# Patient Record
Sex: Female | Born: 2000 | Race: White | Hispanic: No | Marital: Single | Smoking: Never smoker
Health system: Southern US, Community
[De-identification: ages and names within clinical notes are randomized; demographics above are authoritative.]

## PROBLEM LIST (undated history)

## (undated) DIAGNOSIS — Z789 Other specified health status: Secondary | ICD-10-CM

## (undated) HISTORY — PX: FRACTURE SURGERY: SHX138

---
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
## 2020-08-24 HISTORY — PX: OTHER SURGICAL HISTORY: SHX169

## 2021-10-25 ENCOUNTER — Encounter (HOSPITAL_BASED_OUTPATIENT_CLINIC_OR_DEPARTMENT_OTHER): Payer: Self-pay | Admitting: Orthopedic Surgery

## 2021-10-26 ENCOUNTER — Encounter (HOSPITAL_BASED_OUTPATIENT_CLINIC_OR_DEPARTMENT_OTHER): Payer: Self-pay | Admitting: Orthopedic Surgery

## 2021-10-26 DIAGNOSIS — S83241A Other tear of medial meniscus, current injury, right knee, initial encounter: Secondary | ICD-10-CM | POA: Diagnosis present

## 2021-10-26 HISTORY — DX: Other tear of medial meniscus, current injury, right knee, initial encounter: S83.241A

## 2021-10-26 NOTE — H&P (Signed)
Whitney Gardner is an 21 y.o. female.   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 The MRI on 08-13-21 shows no definite abnormalities. However, I reviewed this MRI with the radiologist and we both agree she has significant findings of a probable meniscal tear. She continues to have locking, catching and popping in her knee. She has not responded to injections, physical therapy and rest. She is having pain,swelling, locking, catching and popping.  Past Medical History:  Diagnosis Date   Acute medial meniscus tear of right knee 10/26/2021   Medical history non-contributory     Past Surgical History:  Procedure Laterality Date   colonoscopy with biopsy  08/24/2020   FRACTURE SURGERY Right    finger    History reviewed. No pertinent family history. Social History:  has no history on file for tobacco use, alcohol use, and drug use.  Allergies: No Known Allergies  Meds: Nikki 3mg     Review of Systems  Constitutional: Negative.   HENT: Negative.    Eyes: Negative.   Respiratory: Negative.    Cardiovascular: Negative.   Gastrointestinal: Negative.   Endocrine: Negative.   Genitourinary: Negative.   Musculoskeletal:  Positive for gait problem and joint swelling.  Skin: Negative.   Allergic/Immunologic: Negative.   Hematological: Negative.   Psychiatric/Behavioral: Negative.     Height 5\' 10"  (1.778 m), weight 54.3 kg. Physical Exam Constitutional:      Appearance: Normal appearance. She is normal weight.  HENT:     Head: Normocephalic and atraumatic.     Right Ear: External ear normal.     Left Ear: External ear normal.     Nose: Nose normal.     Mouth/Throat:     Mouth: Mucous membranes are moist.     Pharynx: Oropharynx is clear.  Eyes:     Conjunctiva/sclera: Conjunctivae normal.  Cardiovascular:     Rate and Rhythm: Normal rate.   Pulmonary:     Effort: Pulmonary effort is normal.  Abdominal:     Palpations: Abdomen is soft.  Genitourinary:    Comments: Not pertinent to current symptomatology therefore not examined.  Musculoskeletal:     Cervical back: Neck supple.     Comments: On examination the right knee reveals pain medially. Positive medial McMurray's. She has 1+ effusion. The range of motion is from0-120 degrees.  The knee is stable.   Skin:    General: Skin is warm.     Capillary Refill: Capillary refill takes less than 2 seconds.  Neurological:     General: No focal deficit present.     Mental Status: She is alert.  Psychiatric:        Mood and Affect: Mood normal.     Assessment Principal Problem:   Acute medial meniscus tear of right knee    Plan Right knee arthroscopy with partial medial meniscectomy.  The risks, benefits, and possible complications of the procedure were discussed in detail with the patient.  The patient is without question.   Karim Aiello J Mycal Conde, PA-C 10/26/2021, 12:13 PM

## 2021-10-28 ENCOUNTER — Encounter (HOSPITAL_BASED_OUTPATIENT_CLINIC_OR_DEPARTMENT_OTHER): Payer: Self-pay | Admitting: Orthopedic Surgery

## 2021-10-28 ENCOUNTER — Other Ambulatory Visit: Payer: Self-pay

## 2021-10-28 NOTE — Progress Notes (Signed)

## 2021-10-30 ENCOUNTER — Encounter (HOSPITAL_BASED_OUTPATIENT_CLINIC_OR_DEPARTMENT_OTHER): Payer: Self-pay | Admitting: Orthopedic Surgery

## 2021-10-30 ENCOUNTER — Ambulatory Visit (HOSPITAL_BASED_OUTPATIENT_CLINIC_OR_DEPARTMENT_OTHER): Payer: No Typology Code available for payment source | Admitting: Certified Registered"

## 2021-10-30 ENCOUNTER — Encounter (HOSPITAL_BASED_OUTPATIENT_CLINIC_OR_DEPARTMENT_OTHER)
Admission: RE | Disposition: A | Discharge status: Home / Self Care | Payer: Self-pay | Source: Home / Self Care | Attending: Orthopedic Surgery

## 2021-10-30 ENCOUNTER — Ambulatory Visit (HOSPITAL_BASED_OUTPATIENT_CLINIC_OR_DEPARTMENT_OTHER)
Admission: RE | Admit: 2021-10-30 | Discharge: 2021-10-30 | Disposition: A | Discharge status: Home / Self Care | Payer: No Typology Code available for payment source | Attending: Orthopedic Surgery | Admitting: Orthopedic Surgery

## 2021-10-30 ENCOUNTER — Other Ambulatory Visit: Payer: Self-pay

## 2021-10-30 DIAGNOSIS — X501XXA Overexertion from prolonged static or awkward postures, initial encounter: Secondary | ICD-10-CM | POA: Insufficient documentation

## 2021-10-30 DIAGNOSIS — S83241A Other tear of medial meniscus, current injury, right knee, initial encounter: Secondary | ICD-10-CM | POA: Diagnosis not present

## 2021-10-30 HISTORY — PX: KNEE ARTHROSCOPY WITH MEDIAL MENISECTOMY: SHX5651

## 2021-10-30 HISTORY — DX: Other specified health status: Z78.9

## 2021-10-30 LAB — POCT PREGNANCY, URINE: Preg Test, Ur: NEGATIVE

## 2021-10-30 SURGERY — ARTHROSCOPY, KNEE, WITH MEDIAL MENISCECTOMY
Anesthesia: Regional | Site: Knee | Laterality: Right

## 2021-10-30 MED ORDER — POVIDONE-IODINE 7.5 % EX SOLN
Freq: Once | CUTANEOUS | Status: AC
  Filled 2021-10-30: qty 118

## 2021-10-30 MED ORDER — DEXAMETHASONE SODIUM PHOSPHATE 4 MG/ML IJ SOLN
INTRAMUSCULAR | Status: DC | PRN
  Administered 2021-10-30: 5 mg via INTRAVENOUS

## 2021-10-30 MED ORDER — PROMETHAZINE HCL 12.5 MG PO TABS: 12.5000 mg | ORAL_TABLET | Freq: Four times a day (QID) | ORAL | 0 refills | Status: DC | PRN

## 2021-10-30 MED ORDER — CEFAZOLIN SODIUM-DEXTROSE 2-4 GM/100ML-% IV SOLN
2.0000 g | INTRAVENOUS | Status: AC
  Administered 2021-10-30: 2 g via INTRAVENOUS

## 2021-10-30 MED ORDER — MIDAZOLAM HCL 2 MG/2ML IJ SOLN
2.0000 mg | Freq: Once | INTRAMUSCULAR | Status: AC
  Administered 2021-10-30: 2 mg via INTRAVENOUS

## 2021-10-30 MED ORDER — LACTATED RINGERS IV SOLN: INTRAVENOUS | Status: DC

## 2021-10-30 MED ORDER — FENTANYL CITRATE (PF) 100 MCG/2ML IJ SOLN
INTRAMUSCULAR | Status: DC | PRN
  Administered 2021-10-30 (×2): 25 ug via INTRAVENOUS
  Administered 2021-10-30: 50 ug via INTRAVENOUS

## 2021-10-30 MED ORDER — LIDOCAINE 2% (20 MG/ML) 5 ML SYRINGE
INTRAMUSCULAR | Status: DC | PRN
  Administered 2021-10-30: 60 mg via INTRAVENOUS

## 2021-10-30 MED ORDER — BUPIVACAINE-EPINEPHRINE (PF) 0.25% -1:200000 IJ SOLN
INTRAMUSCULAR | Status: AC
  Filled 2021-10-30: qty 30

## 2021-10-30 MED ORDER — BUPIVACAINE HCL (PF) 0.5 % IJ SOLN
INTRAMUSCULAR | Status: DC | PRN
Start: 2021-10-30 — End: 2021-10-30
  Administered 2021-10-30: 20 mL via INTRA_ARTICULAR

## 2021-10-30 MED ORDER — MIDAZOLAM HCL 2 MG/2ML IJ SOLN
INTRAMUSCULAR | Status: AC
  Filled 2021-10-30: qty 2

## 2021-10-30 MED ORDER — PROPOFOL 10 MG/ML IV BOLUS
INTRAVENOUS | Status: DC | PRN
  Administered 2021-10-30: 150 mg via INTRAVENOUS

## 2021-10-30 MED ORDER — SODIUM CHLORIDE 0.9 % IR SOLN
Status: DC | PRN
  Administered 2021-10-30: 3000 mL

## 2021-10-30 MED ORDER — HYDROCODONE-ACETAMINOPHEN 5-325 MG PO TABS: 1.0000 | ORAL_TABLET | ORAL | 0 refills | Status: DC | PRN

## 2021-10-30 MED ORDER — FENTANYL CITRATE (PF) 100 MCG/2ML IJ SOLN
INTRAMUSCULAR | Status: AC
  Filled 2021-10-30: qty 2

## 2021-10-30 MED ORDER — FENTANYL CITRATE (PF) 100 MCG/2ML IJ SOLN: 25.0000 ug | INTRAMUSCULAR | Status: DC | PRN

## 2021-10-30 MED ORDER — ACETAMINOPHEN 10 MG/ML IV SOLN: 1000.0000 mg | Freq: Once | INTRAVENOUS | Status: DC | PRN

## 2021-10-30 MED ORDER — CEFAZOLIN SODIUM-DEXTROSE 2-4 GM/100ML-% IV SOLN
INTRAVENOUS | Status: AC
  Filled 2021-10-30: qty 100

## 2021-10-30 MED ORDER — EPINEPHRINE PF 1 MG/ML IJ SOLN
INTRAMUSCULAR | Status: AC
  Filled 2021-10-30: qty 3

## 2021-10-30 MED ORDER — ONDANSETRON HCL 4 MG/2ML IJ SOLN
INTRAMUSCULAR | Status: DC | PRN
Start: 2021-10-30 — End: 2021-10-30
  Administered 2021-10-30: 4 mg via INTRAVENOUS

## 2021-10-30 MED ORDER — FENTANYL CITRATE (PF) 100 MCG/2ML IJ SOLN
100.0000 ug | Freq: Once | INTRAMUSCULAR | Status: AC
  Administered 2021-10-30: 50 ug via INTRAVENOUS

## 2021-10-30 MED ORDER — EPHEDRINE 5 MG/ML INJ
INTRAVENOUS | Status: AC
  Filled 2021-10-30: qty 5

## 2021-10-30 MED ORDER — DEXAMETHASONE SODIUM PHOSPHATE 10 MG/ML IJ SOLN: 8.0000 mg | Freq: Once | INTRAMUSCULAR | Status: DC

## 2021-10-30 MED ORDER — POVIDONE-IODINE 10 % EX SWAB
2.0000 "application " | Freq: Once | CUTANEOUS | Status: AC
  Administered 2021-10-30: 2 via TOPICAL

## 2021-10-30 MED ORDER — PROMETHAZINE HCL 25 MG/ML IJ SOLN: 6.2500 mg | INTRAMUSCULAR | Status: DC | PRN

## 2021-10-30 SURGICAL SUPPLY — 27 items
BNDG ELASTIC 6X5.8 VLCR STR LF (GAUZE/BANDAGES/DRESSINGS) ×3 IMPLANT
DISSECTOR  3.8MM X 13CM (MISCELLANEOUS) ×2
DISSECTOR 3.8MM X 13CM (MISCELLANEOUS) ×2 IMPLANT
DRAPE ARTHROSCOPY W/POUCH 90 (DRAPES) ×3 IMPLANT
DURAPREP 26ML APPLICATOR (WOUND CARE) ×3 IMPLANT
GAUZE SPONGE 4X4 12PLY STRL (GAUZE/BANDAGES/DRESSINGS) ×3 IMPLANT
GAUZE XEROFORM 1X8 LF (GAUZE/BANDAGES/DRESSINGS) ×3 IMPLANT
GLOVE SURG ENC MOIS LTX SZ7 (GLOVE) ×3 IMPLANT
GLOVE SURG MICRO LTX SZ7.5 (GLOVE) ×3 IMPLANT
GLOVE SURG UNDER POLY LF SZ7 (GLOVE) ×3 IMPLANT
GLOVE SURG UNDER POLY LF SZ7.5 (GLOVE) ×3 IMPLANT
GOWN STRL REUS W/ TWL LRG LVL3 (GOWN DISPOSABLE) ×4 IMPLANT
GOWN STRL REUS W/ TWL XL LVL3 (GOWN DISPOSABLE) ×2 IMPLANT
GOWN STRL REUS W/TWL LRG LVL3 (GOWN DISPOSABLE) ×4
GOWN STRL REUS W/TWL XL LVL3 (GOWN DISPOSABLE) ×2
MANIFOLD NEPTUNE II (INSTRUMENTS) ×3 IMPLANT
NDL SAFETY ECLIPSE 18X1.5 (NEEDLE) ×4 IMPLANT
NEEDLE HYPO 18GX1.5 SHARP (NEEDLE) ×2
NEEDLE HYPO 22GX1.5 SAFETY (NEEDLE) ×3 IMPLANT
PACK ARTHROSCOPY DSU (CUSTOM PROCEDURE TRAY) ×3 IMPLANT
PACK BASIN DAY SURGERY FS (CUSTOM PROCEDURE TRAY) ×3 IMPLANT
SUT ETHILON 4 0 PS 2 18 (SUTURE) ×3 IMPLANT
SYR 5ML LL (SYRINGE) ×3 IMPLANT
TOWEL GREEN STERILE FF (TOWEL DISPOSABLE) ×3 IMPLANT
TUBING ARTHROSCOPY IRRIG 16FT (MISCELLANEOUS) ×3 IMPLANT
WATER STERILE IRR 1000ML POUR (IV SOLUTION) ×3 IMPLANT
WRAP KNEE MAXI GEL POST OP (GAUZE/BANDAGES/DRESSINGS) ×3 IMPLANT

## 2021-10-30 NOTE — Interval H&P Note (Signed)
History and Physical Interval Note:  10/30/2021 9:58 AM  Whitney Gardner  has presented today for surgery, with the diagnosis of RIGHT KNEE MEDIAL MENISCUS TEAR.  The various methods of treatment have been discussed with the patient and family. After consideration of risks, benefits and other options for treatment, the patient has consented to  Procedure(s): KNEE ARTHROSCOPY WITH MEDIAL MENISECTOMY (Right) as a surgical intervention.  The patient's history has been reviewed, patient examined, no change in status, stable for surgery.  I have reviewed the patient's chart and labs.  Questions were answered to the patient's satisfaction.     Nilda Simmer

## 2021-10-30 NOTE — Anesthesia Postprocedure Evaluation (Signed)
Anesthesia Post Note  Patient: Whitney Gardner  Procedure(s) Performed: KNEE ARTHROSCOPY WITH MEDIAL MENISECTOMY (Right: Knee)     Patient location during evaluation: PACU Anesthesia Type: Regional and General Level of consciousness: awake and alert Pain management: pain level controlled Vital Signs Assessment: post-procedure vital signs reviewed and stable Respiratory status: spontaneous breathing, nonlabored ventilation, respiratory function stable and patient connected to nasal cannula oxygen Cardiovascular status: blood pressure returned to baseline and stable Postop Assessment: no apparent nausea or vomiting Anesthetic complications: no   No notable events documented.  Last Vitals:  Vitals:   10/30/21 1100 10/30/21 1136  BP: 99/66 114/73  Pulse: 82 78  Resp: 18 20  Temp:  36.4 C  SpO2: 99% 100%    Last Pain:  Vitals:   10/30/21 1136  TempSrc: Oral  PainSc: 1                  Lyris Hitchman P Justina Bertini

## 2021-10-30 NOTE — Op Note (Signed)
[name], Gardner [facility] MEDICAL RECORD NO: [id number] ACCOUNT NO: [id number] DATE OF BIRTH: 29-Sep-2001 FACILITY: [facility] LOCATION: [facility] PHYSICIAN: Elana Alm. Thurston Hole, MD  Operative Report   DATE OF PROCEDURE: 10/30/2021  PREOPERATIVE DIAGNOSIS:  Right knee medial meniscus tear.  POSTOPERATIVE DIAGNOSIS:  Right knee medial plica with synovitis.  PROCEDURES:  Right knee EUA followed by arthroscopic plica excision with synovectomy.  SURGEON:  Elana Alm. Thurston Hole, MD  ASSISTANT:  Julien Girt, PA  ANESTHESIA:  General.  OPERATIVE TIME:  30 minutes.  COMPLICATIONS:  None.  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  Exam, x-rays and MRI have revealed a possible meniscal tear versus plica and synovitis.  She has failed conservative  care, and is now to undergo arthroscopy.  DESCRIPTION OF PROCEDURE:  The patient was brought to the operating room on 10/30/2021 after knee block was placed in the holding room by Anesthesia.  She was placed on the operating table in supine position.  She received antibiotics preoperatively for  prophylaxis.  After being placed under general anesthesia, her right knee was examined.  She had range of motion 0-130 degrees.  Knee was stable to ligamentous exam with normal patellar tracking.  The right leg was prepped using sterile DuraPrep and  draped using sterile technique.  Timeout procedure was called and the correct right knee identified.  Initially through an anterolateral portal, the arthroscope with a pump attached was placed in through an anteromedial portal, an arthroscopic probe was  placed.  On initial inspection, the medial compartment, the articular cartilage was intact.  Medial meniscus was completely and thoroughly probed and there was no evidence of a tear.  Intercondylar notch inspected.  Anterior and posterior cruciate  ligaments were normal.  Lateral  compartment inspected.  The articular cartilage was normal.  Lateral meniscus was normal.  The patellofemoral joint articular cartilage was normal, but there was a large thickened medial plica band extending and impinging  into the medial patellofemoral joint and this was thoroughly resected.  Otherwise, the medial and lateral gutters were free of pathology.  At this point, it was felt that all pathology had been satisfactorily addressed.  The instruments were removed.   Portals were closed with 3-0 nylon suture.  Sterile dressings were applied and the patient was awakened and taken to recovery room in stable condition.  FOLLOWUP CARE:  The patient will be followed as an outpatient on hydrocodone and Phenergan.  See me back office in a week for sutures out and followup.   PUS D: 10/30/2021 10:32:31 am T: 10/30/2021 3:37:00 pm  JOB: [id number]/ [id number]

## 2021-10-30 NOTE — Anesthesia Procedure Notes (Signed)
Procedure Name: LMA Insertion Date/Time: 10/30/2021 10:10 AM Performed by: Sheryn Bison, CRNA Pre-anesthesia Checklist: Patient identified, Emergency Drugs available, Suction available and Patient being monitored Patient Re-evaluated:Patient Re-evaluated prior to induction Oxygen Delivery Method: Circle System Utilized Preoxygenation: Pre-oxygenation with 100% oxygen Induction Type: IV induction Ventilation: Mask ventilation without difficulty LMA: LMA inserted LMA Size: 4.0 Number of attempts: 1 Airway Equipment and Method: bite block Placement Confirmation: positive ETCO2 Tube secured with: Tape Dental Injury: Teeth and Oropharynx as per pre-operative assessment

## 2021-10-30 NOTE — Anesthesia Procedure Notes (Signed)
Anesthesia Regional Block: Knee block   Pre-Anesthetic Checklist: , timeout performed,  Correct Patient, Correct Site, Correct Laterality,  Correct Procedure, Correct Position, site marked,  Risks and benefits discussed,  Surgical consent,  Pre-op evaluation,  At surgeon's request and post-op pain management  Laterality: Right  Prep: Dura Prep       Needles:  Injection technique: Single-shot       Needle Gauge: 25     Additional Needles:   Narrative:  Start time: 10/30/2021 9:43 AM End time: 10/30/2021 9:47 AM Injection made incrementally with aspirations every 5 mL.  Performed by: Personally  Anesthesiologist: Atilano Median, DO  Additional Notes: Patient identified. Risks/Benefits/Options discussed with patient including but not limited to bleeding, infection, nerve damage, failed block, incomplete pain control. Patient expressed understanding and wished to proceed. All questions were answered. Sterile technique was used throughout the entire procedure. Please see nursing notes for vital signs. Aspirated in 5cc intervals with injection for negative confirmation. Patient was given instructions on fall risk and not to get out of bed. All questions and concerns addressed with instructions to call with any issues or inadequate analgesia.     - Intraarticular injection at surgeon's request. Sterile technique, inferolateral approach with knee flexion. Negative heme, negative fluid with incremental aspiration.

## 2021-10-30 NOTE — Progress Notes (Signed)
Assisted Dr. Jana Half with right knee block. Side rails up, monitors on throughout procedure. See vital signs in flow sheet. Tolerated Procedure well.

## 2021-10-30 NOTE — Anesthesia Preprocedure Evaluation (Signed)
Anesthesia Evaluation  Patient identified by MRN, date of birth, ID band Patient awake    Reviewed: Allergy & Precautions, NPO status , Patient's Chart, lab work & pertinent test results  Airway Mallampati: II  TM Distance: >3 FB Neck ROM: Full    Dental no notable dental hx.    Pulmonary neg pulmonary ROS,    Pulmonary exam normal        Cardiovascular negative cardio ROS   Rhythm:Regular Rate:Normal     Neuro/Psych negative neurological ROS  negative psych ROS   GI/Hepatic negative GI ROS, Neg liver ROS,   Endo/Other  negative endocrine ROS  Renal/GU negative Renal ROS  negative genitourinary   Musculoskeletal Medial meniscus tear   Abdominal Normal abdominal exam  (+)   Peds  Hematology negative hematology ROS (+)   Anesthesia Other Findings   Reproductive/Obstetrics                             Anesthesia Physical Anesthesia Plan  ASA: 1  Anesthesia Plan: General and Regional   Post-op Pain Management: Regional block   Induction: Intravenous  PONV Risk Score and Plan: 3 and Ondansetron, Dexamethasone, Midazolam and Treatment may vary due to age or medical condition  Airway Management Planned: Mask and LMA  Additional Equipment: None  Intra-op Plan:   Post-operative Plan: Extubation in OR  Informed Consent: I have reviewed the patients History and Physical, chart, labs and discussed the procedure including the risks, benefits and alternatives for the proposed anesthesia with the patient or authorized representative who has indicated his/her understanding and acceptance.     Dental advisory given  Plan Discussed with: CRNA  Anesthesia Plan Comments: (Lab Results      Component                Value               Date                      PREGTESTUR               NEGATIVE            10/30/2021          )        Anesthesia Quick Evaluation

## 2021-10-30 NOTE — Transfer of Care (Signed)
Immediate Anesthesia Transfer of Care Note  Patient: Whitney Gardner  Procedure(s) Performed: KNEE ARTHROSCOPY WITH MEDIAL MENISECTOMY (Right: Knee)  Patient Location: PACU  Anesthesia Type:General  Level of Consciousness: drowsy and patient cooperative  Airway & Oxygen Therapy: Patient Spontanous Breathing and Patient connected to face mask oxygen  Post-op Assessment: Report given to RN and Post -op Vital signs reviewed and stable  Post vital signs: Reviewed and stable  Last Vitals:  Vitals Value Taken Time  BP 103/64 10/30/21 1036  Temp    Pulse 59 10/30/21 1037  Resp 11 10/30/21 1037  SpO2 100 % 10/30/21 1037  Vitals shown include unvalidated device data.  Last Pain:  Vitals:   10/30/21 0914  TempSrc: Oral  PainSc: 4       Patients Stated Pain Goal: 4 (10/30/21 0914)  Complications: No notable events documented.

## 2021-10-30 NOTE — Discharge Instructions (Signed)

## 2021-10-31 ENCOUNTER — Encounter (HOSPITAL_BASED_OUTPATIENT_CLINIC_OR_DEPARTMENT_OTHER): Payer: Self-pay | Admitting: Orthopedic Surgery

## 2021-10-31 NOTE — Addendum Note (Signed)
Addendum  created 10/31/21 0734 by Thornell Mule, CRNA   Charge Capture section accepted

## 2022-01-24 ENCOUNTER — Other Ambulatory Visit: Payer: No Typology Code available for payment source

## 2022-01-24 DIAGNOSIS — Z0000 Encounter for general adult medical examination without abnormal findings: Secondary | ICD-10-CM

## 2022-01-25 LAB — CBC WITH DIFFERENTIAL/PLATELET
Absolute Monocytes: 774 cells/uL (ref 200–950)
Basophils Absolute: 48 cells/uL (ref 0–200)
Basophils Relative: 0.4 %
Eosinophils Absolute: 95 cells/uL (ref 15–500)
Eosinophils Relative: 0.8 %
HCT: 37.8 % (ref 35.0–45.0)
Hemoglobin: 12.6 g/dL (ref 11.7–15.5)
Lymphs Abs: 3189 cells/uL (ref 850–3900)
MCH: 31.6 pg (ref 27.0–33.0)
MCHC: 33.3 g/dL (ref 32.0–36.0)
MCV: 94.7 fL (ref 80.0–100.0)
MPV: 10.7 fL (ref 7.5–12.5)
Monocytes Relative: 6.5 %
Neutro Abs: 7795 cells/uL (ref 1500–7800)
Neutrophils Relative %: 65.5 %
Platelets: 346 10*3/uL (ref 140–400)
RBC: 3.99 10*6/uL (ref 3.80–5.10)
RDW: 11.8 % (ref 11.0–15.0)
Total Lymphocyte: 26.8 %
WBC: 11.9 10*3/uL — ABNORMAL HIGH (ref 3.8–10.8)

## 2022-01-25 LAB — COMPLETE METABOLIC PANEL WITH GFR
AG Ratio: 1.6 (calc) (ref 1.0–2.5)
ALT: 6 U/L (ref 6–29)
AST: 14 U/L (ref 10–30)
Albumin: 4.3 g/dL (ref 3.6–5.1)
Alkaline phosphatase (APISO): 44 U/L (ref 31–125)
BUN: 9 mg/dL (ref 7–25)
CO2: 27 mmol/L (ref 20–32)
Calcium: 9.7 mg/dL (ref 8.6–10.2)
Chloride: 103 mmol/L (ref 98–110)
Creat: 0.84 mg/dL (ref 0.50–0.96)
Globulin: 2.7 g/dL (calc) (ref 1.9–3.7)
Glucose, Bld: 81 mg/dL (ref 65–99)
Potassium: 4.7 mmol/L (ref 3.5–5.3)
Sodium: 138 mmol/L (ref 135–146)
Total Bilirubin: 0.5 mg/dL (ref 0.2–1.2)
Total Protein: 7 g/dL (ref 6.1–8.1)
eGFR: 102 mL/min/{1.73_m2} (ref >=60)

## 2022-01-25 LAB — TSH: TSH: 1.04 mIU/L

## 2022-01-25 LAB — LIPID PANEL
Cholesterol: 170 mg/dL (ref <200)
HDL: 81 mg/dL (ref >=50)
LDL Cholesterol (Calc): 73 mg/dL (calc)
Non-HDL Cholesterol (Calc): 89 mg/dL (calc) (ref <130)
Total CHOL/HDL Ratio: 2.1 (calc) (ref <5.0)
Triglycerides: 76 mg/dL (ref <150)

## 2022-01-28 ENCOUNTER — Encounter: Payer: Self-pay | Admitting: Internal Medicine

## 2022-01-28 ENCOUNTER — Ambulatory Visit (INDEPENDENT_AMBULATORY_CARE_PROVIDER_SITE_OTHER): Payer: No Typology Code available for payment source | Admitting: Internal Medicine

## 2022-01-28 VITALS — BP 108/72 | HR 90 | Resp 16 | Ht 68.5 in | Wt 116.0 lb

## 2022-01-28 DIAGNOSIS — R197 Diarrhea, unspecified: Secondary | ICD-10-CM

## 2022-01-28 DIAGNOSIS — N926 Irregular menstruation, unspecified: Secondary | ICD-10-CM | POA: Diagnosis not present

## 2022-01-28 MED ORDER — HYOSCYAMINE SULFATE SL 0.125 MG SL SUBL: 1.0000 | SUBLINGUAL_TABLET | Freq: Three times a day (TID) | SUBLINGUAL | Status: DC

## 2022-01-28 NOTE — Progress Notes (Addendum)
Subjective:    Patient ID: Whitney Gardner, female    DOB: [birth year], 21 y.o.   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  Patient says she has lost a good deal of weight.  Says she weighed  160 pounds in high school.  Says she came down with Covid-19 and lost about 50 pounds.  Says she lost taste and smell.  Now has decreased appetite. Only eats one time a day.  Her mother is Gari Trovato who is also a patient here.  Her maternal grandmother is also a patient here.  Has been seen by Pediatrician, Dr. Volney American in 2021.  We do not have those records.  In July 2020 she was seen in sports medicine clinic at [facility] by Dr. Charlies Silvers, for healthcare maintenance with weight loss and decreased appetite.  Weight at that time was 120 pounds.  Was evaluated at [place] in October 2021 for subluxation of left shoulder.  Exercises were prescribed.  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  In September 2020 she had a closed displaced fracture of distal phalanx right fifth finger seen by Dr. Burney Gauze.  Apparently had block pinning for DIP fracture dislocation right fifth finger Fall 2020  Of note: In November 2020, she was diagnosed with COVID-19 at Urgent care on [address] here in [place].  Weight at that time per office records there was 130 pounds.  In July 2021, she had health maintenance exam by sports medicine fellow at [place] and was cleared to participate in sports without restriction.  Was referred to sports nutrition and sports psychology to evaluate weight loss post COVID.  Weight at that time was 120 pounds.  Was seen by Gastroenterology at [facility] for weight loss and generalized abdominal pain in Sept 2021 and initial visit was a telemedicine visit.  Lactose intolerance was discussed.  Symptoms persisted despite  stopping lactose and taking Lactaid pills.  Was having 1 diarrhea stool daily and then episodes at least once a week where there were multiple loose bowel movements a day.  Was having nocturnal bowel movement about once a week.  There was no blood in stool.  Sometimes had urgency but would not have a bowel movement.  Lab studies were ordered in September 2021 including a negative celiac panel.  Sed rate was 2.  C-reactive protein in September 2021 was normal.  Had colonoscopy in August 24, 2020 by Dr. Georgina Quint at [facility].  Terminal ileum was normal.  Entire colon appeared to be normal.  Also, colonic biopsy showed no significant pathology.  No active or chronic colitis.  No lymphocytic or collagenous colitis.  Her weight with that procedure was 126 pounds according to [place] nutritional counseling at [facility] in October 2021 regarding IBS and anxiety.    She tried probiotics and that did not seem to help.  At that time was struggling with falling asleep but once asleep she slept well.  It was felt that she likely had irritable bowel syndrome.  They did discuss lactose intolerance.  She does not smoke or consume alcohol.  She does not use illicit drugs.  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  Is studying Sociology.  Thinks she would like to pursue a career  in Medicine.  Has trouble getting up in the mornings. Doing well in school.  Does not get hungry.   Has anxiety in the morning. Gets nervous over many things -even when her mother calls her. Always anxious and worried. Denies sexual abuse.  Has had a menstrual period for a month now. Started OCP about 6-8 months ago.  Will be referred to GYN physician for prolonged menses.  Apparently had a twisting injury to her right knee in the fall 2022.  She was treated conservatively and continued to have locking catching and popping in her knee.  She did not respond to injections  PT and rest.  She underwent arthroscopic surgery by Dr. Noemi Chapel in January 2023.  There was a large medial plica band extending and impinging into the medial patellofemoral joint.  This was resected.  Arterial cartilage was intact.  There was no meniscal tear apparently.    Review of Systems   Can eat chicken without consequence of diarrhea or abdominal cramping. Sometimes has fecal incontinence accidents if can't get to BR soon enough.     Objective:   Physical Exam BP 108/72 pulse 90 RR 16 pulse ox 99% weight 166 lbs. BMI 17.38 Skin warm and dry.  No cervical adenopathy.  No thyromegaly.  TMs clear.  Chest clear.  No CVA tenderness.  Cardiac exam: Regular rate and rhythm without murmur or ectopy.  Abdomen scaphoid,  without hepatosplenomegaly masses or tenderness.  No deformity of the extremities.  Brief neurological exam intact without gross focal deficits.  Affect thought and judgment appear to be normal.       Assessment & Plan:  Diarrhea and abdominal cramping- Had extensive work-up at [facility] which was negative for inflammatory bowel disease including biopsy.  My thinking is that she has irritable bowel syndrome.  She apparently had a 14 pound weight loss from the time she had COVID-19 until now.We are going to try medication before meals and at bedtime.  It might be good to look at her Pediatric records.  History of surgery for acute medial meniscus tear by Dr. Noemi Chapel in January 2023  Anxiety-she is concerned about her weight loss and diarrhea.  Prolonged menses-will be referred to GYN physician as soon as possible.  She is on oral contraceptives.  Labs drawn include CBC with differential and white blood cell count is slightly elevated at 11,900.  Hemoglobin 12.6 g.  MCV normal at 97.7 and platelet count 346,000.  C-Met is normal.  TSH and lipids are normal.  Plan: Patient will try dicyclomine (Bentyl) 20 mg 1 tablet before meals and at bedtime.  She will call next week and let me  know how things are going with that.     She will be referred to GYN physician for prolonged menses.  Patient reports being on birth control pills (NIKKI)  We will plan to see her back very soon.

## 2022-01-30 ENCOUNTER — Telehealth: Payer: Self-pay | Admitting: Internal Medicine

## 2022-01-30 MED ORDER — DICYCLOMINE HCL 20 MG PO TABS: 20.0000 mg | ORAL_TABLET | Freq: Three times a day (TID) | ORAL | 1 refills | Status: DC

## 2022-01-30 NOTE — Telephone Encounter (Signed)
Medication sent in. 

## 2022-01-30 NOTE — Telephone Encounter (Signed)
Whitney Gardner ?[id number] ? ?[name] called to say she was supposed to get a medication filled that started with a B for her stool. I also let her know that GYN had tried to get in touch with her to schedule an appointment, and I gave her a phone number for GI. ?

## 2022-02-01 NOTE — Patient Instructions (Addendum)
I have extensively reviewed records from [facility], from urgent care when she was diagnosed with COVID-19.  I do not have pediatric records.  We are going to try medication before meals and at bedtime to see if diarrhea will improve.  It might be worth getting stool studies for ova and parasites, lactoferrin, stool culture, C. difficile, stool for white blood cells.  She will be referred to GYN physician for persistent menses.  We will follow-up with her in approximately 2 weeks.  She may benefit from counseling for anxiety issues. ?

## 2022-02-03 ENCOUNTER — Other Ambulatory Visit: Payer: Self-pay

## 2022-02-10 ENCOUNTER — Ambulatory Visit: Payer: No Typology Code available for payment source | Admitting: Internal Medicine

## 2022-02-11 ENCOUNTER — Ambulatory Visit (INDEPENDENT_AMBULATORY_CARE_PROVIDER_SITE_OTHER): Payer: No Typology Code available for payment source | Admitting: Internal Medicine

## 2022-02-11 ENCOUNTER — Encounter: Payer: Self-pay | Admitting: Internal Medicine

## 2022-02-11 VITALS — BP 98/64 | HR 78 | Temp 99.0°F | Ht 68.5 in | Wt 112.5 lb

## 2022-02-11 DIAGNOSIS — K580 Irritable bowel syndrome with diarrhea: Secondary | ICD-10-CM

## 2022-02-11 NOTE — Progress Notes (Signed)
? ?  Subjective:  ? ? Patient ID: Whitney Gardner, female    DOB: 02/28/2001, 21 y.o.   MRN: [id number] ? ?HPI She was seen for evaluation on April 18 for the first time.  She was having issues with frequent bouts of diarrhea.  This problem started when she was playing softball for [employer] in the Fall 2021.  Symptoms persisted despite stopping lactose and taking Lactaid pills.  She had colonoscopy November 2021 at [facility] and terminal ileum was normal.  Entire colon appeared to be normal and colonic biopsy showed no significant pathology.  She tried probiotics which did not help.  It was felt she likely had irritable bowel syndrome.  They did discuss possibility of lactose intolerance.  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 ? ?When she was seen here on April 18, it was decided she would be tried on benzoyl 20 mg 1 tablet before meals and at bedtime.  She says her bowel movements have subsequently decreased in frequency and have more consistency at this time. ? ?She was also having some issues with prolonged menses.  A GYN appointment has been made for her and I have advised her to keep that appointment. ? ?She had COVID-19 in November 2020. ? ? ? ?Review of Systems see above-no new complaints ? ?   ?Objective:  ? Physical Exam ? ?Vital signs are reviewed.  Her weight is 112 pounds 8 ounces BMI 16.86 pulse 78 blood pressure 98/64.  Previous weight was 116 pounds.  I do not know how to explain this weight deficit from last visit but she says she feels much better and is having normal bowel movements and certainly looks happier than she did at last visit. ?She is having no abdominal pain.  Abdomen is scaphoid without hepatosplenomegaly ? ?   ?Assessment & Plan:  ? ?Irritable bowel syndrome-need to continue to monitor her weight carefully.  She has follow-up appointment in 4 months. ? ?Continue Bentyl as previously prescribed ? ?Encouraged  to see GYN regarding prolonged menses. ? ?Total of 30 minutes spent evaluating patient, counseling patient. ?

## 2022-02-13 ENCOUNTER — Other Ambulatory Visit: Payer: Self-pay | Admitting: Internal Medicine

## 2022-02-18 NOTE — Patient Instructions (Signed)
We are glad you have improved with your symptoms.  Continue Bentyl as directed and follow-up in 4 months.  Please call sooner if you are having weight issues or any other concerns.  Please see GYN physician. ?

## 2022-06-19 ENCOUNTER — Telehealth: Payer: Self-pay | Admitting: Internal Medicine

## 2022-06-19 ENCOUNTER — Encounter: Payer: Self-pay | Admitting: Internal Medicine

## 2022-06-19 NOTE — Telephone Encounter (Signed)
Left voice mail message for patient about 4 month follow up appt today previously scheduled. Patient did not keep appt. Phone call was to check in with pt and see how she is doing. MJb, MD

## 2022-08-12 ENCOUNTER — Telehealth: Payer: Self-pay | Admitting: Internal Medicine

## 2022-08-12 NOTE — Telephone Encounter (Signed)
Whitney Gardner [phone]  Whitney Gardner called to question about her missed appointment saying that the appointment was made with out her knowledge. This appointment was made at her last appointment because she was to follow up in 4 months. Appointment was made on 02/11/2022 at 3:08 PM and she checked out at 3:09 PM for her 2:30 appointment. This was her 2nd appointment with Dr Renold Genta, the first one was 01/28/2022.

## 2022-08-20 NOTE — Telephone Encounter (Signed)
I am sending an email to remove the no show fee as a one time courtesy. I spoke to patient's mother and reviewed expectations of patient's utilizing mychart to keep up with scheduled appointments.I also put text reminders as preferred communication with patient. This will be a one time courtesy.

## 2023-09-02 ENCOUNTER — Encounter: Payer: Self-pay | Admitting: Family

## 2023-09-02 ENCOUNTER — Ambulatory Visit (INDEPENDENT_AMBULATORY_CARE_PROVIDER_SITE_OTHER): Payer: No Typology Code available for payment source | Admitting: Family

## 2023-09-02 VITALS — BP 94/62 | HR 74 | Temp 98.0°F | Ht 70.0 in | Wt 131.1 lb

## 2023-09-02 DIAGNOSIS — F419 Anxiety disorder, unspecified: Secondary | ICD-10-CM

## 2023-09-02 DIAGNOSIS — Z23 Encounter for immunization: Secondary | ICD-10-CM

## 2023-09-02 DIAGNOSIS — Z30011 Encounter for initial prescription of contraceptive pills: Secondary | ICD-10-CM

## 2023-09-02 DIAGNOSIS — F32A Depression, unspecified: Secondary | ICD-10-CM

## 2023-09-02 MED ORDER — DROSPIRENONE-ETHINYL ESTRADIOL 3-0.02 MG PO TABS: 1.0000 | ORAL_TABLET | Freq: Every day | ORAL | 3 refills | Status: AC

## 2023-09-02 MED ORDER — ESCITALOPRAM OXALATE 5 MG PO TABS
5.0000 mg | ORAL_TABLET | Freq: Every day | ORAL | 1 refills | Status: DC
Start: 2023-09-02 — End: 2023-09-24

## 2023-09-02 MED ORDER — HYDROXYZINE HCL 10 MG PO TABS: 10.0000 mg | ORAL_TABLET | Freq: Three times a day (TID) | ORAL | 0 refills | Status: AC | PRN

## 2023-09-02 NOTE — Patient Instructions (Addendum)
Welcome to [facility], It was a pleasure meeting you today!    As discussed, I have sent your medications to your pharmacy. See the attached handout on tips for managing your anxiety.  Please schedule a 1 month follow up visit today, ok if virtual.    PLEASE NOTE: If you had any LAB tests please let us know if you have not heard back within a few days. You may see your results on MyChart before we have a chance to review them but we will give you a call once they are reviewed by [place]. If we ordered any REFERRALS today, please let us know if you have not heard from their office within the next week.  Let us know through MyChart if you are needing REFILLS, or have your pharmacy send [place] the request. You can also use MyChart to communicate with me or any office staff.  Please try these tips to maintain a healthy lifestyle: It is important that you exercise regularly at least 30 minutes 5 times a week. Think about what you will eat, plan ahead. Choose whole foods, & think  "clean, green, fresh or frozen" over canned, processed or packaged foods which are more sugary, salty, and fatty. 70 to 75% of food eaten should be fresh vegetables and protein. 2-3  meals daily with healthy snacks between meals, but must be whole fruit, protein or vegetables. Aim to eat over a 10 hour period when you are active, for example, 7am to 5pm, and then STOP after your last meal of the day, drinking only water.  Shorter eating windows, 6-8 hours, are showing benefits in heart disease and blood sugar regulation. Drink water every day! Shoot for 64 ounces daily = 8 cups, no other drink is as healthy! Fruit juice is best enjoyed in a healthy way, by EATING the fruit.

## 2023-09-02 NOTE — Progress Notes (Signed)
New Patient Office Visit  Subjective:  Patient ID: Whitney Gardner, female    DOB: 06-15-2001  Age: 22 y.o. MRN: [id number]  CC:  Chief Complaint  Patient presents with   New Patient (Initial Visit)   Anxiety And Depression    Pt c/o Anxiety and depression, Pt would like to discuss medication. Pt is currently seeing a therapist.    Contraception    Pt would like to discuss, Has tried pill in the past.     HPI Whitney Gardner presents for establishing care today.  Discussed the use of AI scribe software for clinical note transcription with the patient, who gave verbal consent to proceed.  History of Present Illness   The patient, a senior in college, presents to establish care and has two concerns. Her first concern involves anxiety and depression that has been ongoing for approximately a year. She describes waking up in a panic and a constant feeling of worry, even when there is nothing specific to worry about. She also reports physical symptoms of anxiety, such as a rapid heart rate and restlessness. The patient has been seeing a counselor for the past three weeks, who suggested that medication might be beneficial. The patient's anxiety appears to be multifactorial, with stressors including school, sports, and recent family changes. She has had two surgeries in college, including a broken arm and a torn meniscus, and her parents recently separated. The patient lives alone and is struggling to keep up with schoolwork. She denies any suicidal thoughts.     Assessment & Plan:     Anxiety & depression -  PHQ-9 & GAD scores high. Chronic anxiety for approximately one year, exacerbated by recent life stressors including parental separation and physical injuries. Symptoms include morning panic and constant worry. Currently seeing a counselor. -Start Lexapro 5mg  daily with a plan to reassess in one month. -Start Hydroxyzine 10mg  tid prn for breakthrough anxiety symptoms. -Reviewed use & SE of  both meds. -Encourage continued counseling. -Schedule follow-up appointment in one month, which can be conducted virtually if preferred.  Contraception - Previously on [place] (drospirenone/ethinyl estradiol) with good tolerance, but discontinued due to access issues. Currently sexually active. -Resume Nikki (drospirenone/ethinyl estradiol) for contraception. -Advised on different start methods, f/u in 1 year.  Immunization due -     Tdap vaccine greater than or equal to 7yo IM   Subjective:    Outpatient Medications Prior to Visit  Medication Sig Dispense Refill   dicyclomine (BENTYL) 20 MG tablet TAKE 1 TABLET (20 MG TOTAL) BY MOUTH 4 (FOUR) TIMES DAILY - BEFORE MEALS AND AT BEDTIME. 360 tablet 0   drospirenone-ethinyl estradiol (YAZ) 3-0.02 MG tablet Take 1 tablet by mouth daily.     No facility-administered medications prior to visit.   Past Medical History:  Diagnosis Date   Acute medial meniscus tear of right knee 10/26/2021   Medical history non-contributory    Past Surgical History:  Procedure Laterality Date   colonoscopy with biopsy  08/24/2020   FRACTURE SURGERY Right    finger   KNEE ARTHROSCOPY WITH MEDIAL MENISECTOMY Right 10/30/2021   Procedure: KNEE ARTHROSCOPY WITH MEDIAL MENISECTOMY;  Surgeon: Salvatore Marvel, MD;  Location: [facility];  Service: Orthopedics;  Laterality: Right;    Objective:   Today's Vitals: BP 94/62 (BP Location: Left Arm, Patient Position: Sitting, Cuff Size: Large)   Pulse 74   Temp 98 F (36.7 C) (Temporal)   Ht 5\' 10"  (1.778 m)   Wt 131  lb 2 oz (59.5 kg)   LMP 08/05/2023 (Approximate)   SpO2 96%   BMI 18.81 kg/m   Physical Exam Vitals and nursing note reviewed.  Constitutional:      Appearance: Normal appearance.  Cardiovascular:     Rate and Rhythm: Normal rate and regular rhythm.  Pulmonary:     Effort: Pulmonary effort is normal.     Breath sounds: Normal breath sounds.  Musculoskeletal:        General:  Normal range of motion.  Skin:    General: Skin is warm and dry.  Neurological:     Mental Status: She is alert.  Psychiatric:        Mood and Affect: Mood normal.        Behavior: Behavior normal.    No orders of the defined types were placed in this encounter.   Dulce Sellar, NP

## 2023-09-02 NOTE — Progress Notes (Deleted)
Phone [phone]  Subjective:   Patient is a 22 y.o. female presenting for annual physical.    No chief complaint on file.   See problem oriented charting- ROS- full  review of systems was completed and negative except for *** noted in HPI above.  The following were reviewed and entered/updated in epic: Past Medical History:  Diagnosis Date   Acute medial meniscus tear of right knee 10/26/2021   Medical history non-contributory    Patient Active Problem List   Diagnosis Date Noted   Acute medial meniscus tear of right knee 10/26/2021   Past Surgical History:  Procedure Laterality Date   colonoscopy with biopsy  08/24/2020   FRACTURE SURGERY Right    finger   KNEE ARTHROSCOPY WITH MEDIAL MENISECTOMY Right 10/30/2021   Procedure: KNEE ARTHROSCOPY WITH MEDIAL MENISECTOMY;  Surgeon: Salvatore Marvel, MD;  Location:  SUR[facility]ERY C[facility];  Service: Orthopedics;  Laterality: Right;    No family history on file.  Medications- reviewed and updated Current Outpatient Medications  Medication Sig Dispense Refill   dicyclomine (BENTYL) 20 MG tablet TAKE 1 TABLET (20 MG TOTAL) BY MOUTH 4 (FOUR) TIMES DAILY - BEFORE MEALS AND AT BEDTIME. 360 tablet 0   drospirenone-ethinyl estradiol (YAZ) 3-0.02 MG tablet Take 1 tablet by mouth daily.     No current facility-administered medications for this visit.    Allergies-reviewed and updated No Known Allergies  Social History   Social History Narrative   Not on file    Objective:  There were no vitals taken for this visit. Physical Exam Vitals and nursing note reviewed.  Constitutional:      Appearance: Normal appearance.  HENT:     Head: Normocephalic.     Right Ear: Tympanic membrane normal.     Left Ear: Tympanic membrane normal.     Nose: Nose normal.     Mouth/Throat:     Mouth: Mucous membranes are moist.  Eyes:     Pupils: Pupils are equal, round, and reactive to light.  Cardiovascular:     Rate and Rhythm:  Normal rate and regular rhythm.  Pulmonary:     Effort: Pulmonary effort is normal.     Breath sounds: Normal breath sounds.  Musculoskeletal:        General: Normal range of motion.     Cervical back: Normal range of motion.  Lymphadenopathy:     Cervical: No cervical adenopathy.  Skin:    General: Skin is warm and dry.  Neurological:     Mental Status: She is alert.  Psychiatric:        Mood and Affect: Mood normal.        Behavior: Behavior normal.       Assessment and Plan   Health Maintenance counseling: 1. Anticipatory guidance: Patient counseled regarding regular dental exams q6 months, eye exams,  avoiding smoking and second hand smoke, limiting alcohol to 1 beverage per day, no illicit drugs.   2. Risk factor reduction:  Advised patient of need for regular exercise and diet rich with fruits and vegetables to reduce risk of heart attack and stroke. Wt Readings from Last 3 Encounters:  02/11/22 112 lb 8 oz (51 kg)  01/28/22 116 lb (52.6 kg)  10/30/21 119 lb 0.8 oz (54 kg)   3. Immunizations/screenings/ancillary studies Immunization History  Administered Date(s) Administered   DTaP 12/23/2001, 02/07/2002, 04/14/2002, 07/06/2003, 07/07/2006   HIB (PRP-OMP) 12/23/2001, 02/07/2002, 04/14/2002, 07/06/2003   HPV Quadrivalent 05/15/2017, 06/03/2018, 07/22/2019   Hepatitis A  07/07/2006, 03/26/2007   Hepatitis B 10/13/2001, 11/18/2001, 07/21/2002   IPV 12/23/2001, 02/07/2002, 07/21/2002, 07/07/2006   Influenza Split 07/27/2020   MMR 11/22/2002, 07/07/2006   Meningococcal B, OMV 06/03/2018, 07/22/2019   Meningococcal B, Unspecified 05/23/2014, 06/03/2018   Meningococcal Conjugate 05/23/2014, 06/03/2018   PFIZER(Purple Top)SARS-COV-2 Vaccination 02/25/2020, 03/17/2020, 10/08/2021   Pneumococcal Conjugate-13 02/07/2002, 04/14/2002, 11/22/2002, 12/23/2021   Td 12/23/2001, 02/07/2002, 04/14/2002, 07/06/2003, 07/07/2006   Tdap 05/18/2013   Varicella 11/22/2002, 07/07/2006    Health Maintenance Due  Topic Date Due   HIV Screening  Never done   Hepatitis C Screening  Never done   Cervical Cancer Screening (Pap smear)  Never done   INFLUENZA VACCINE  05/14/2023   DTaP/Tdap/Td (7 - Td or Tdap) 05/19/2023   COVID-19 Vaccine (4 - 2023-24 season) 06/14/2023    4. Cervical cancer screening: *** 5. Skin cancer screening- advised regular sunscreen use. Denies worrisome, changing, or new skin lesions.  6. Birth control/STD check: *** 7. Smoking associated screening: *** smoker 8. Alcohol screening: ***  There are no diagnoses linked to this encounter.  Recommended follow up: ***No follow-ups on file. Future Appointments  Date Time Provider Department Center  09/02/2023 10:30 AM Dulce Sellar, NP [facility]    Lab/Order associations:fasting    Dulce Sellar, NP

## 2023-09-24 ENCOUNTER — Other Ambulatory Visit: Payer: Self-pay | Admitting: Family

## 2023-09-24 DIAGNOSIS — F419 Anxiety disorder, unspecified: Secondary | ICD-10-CM

## 2023-09-30 ENCOUNTER — Telehealth: Payer: No Typology Code available for payment source | Admitting: Family

## 2023-09-30 NOTE — Progress Notes (Deleted)
    MyChart Video Visit    Virtual Visit via Video Note   This format is felt to be most appropriate for this patient at this time. Physical exam was limited by quality of the video and audio technology used for the visit. CMA was able to get the patient set up on a video visit.  Patient location: Home. Patient and provider in visit Provider location: Office  I discussed the limitations of evaluation and management by telemedicine and the availability of in person appointments. The patient expressed understanding and agreed to proceed.  Visit Date: 09/30/2023  Today's healthcare provider: Dulce Sellar, NP     Subjective:   Patient ID: Whitney Gardner, female    DOB: 2001/02/22, 22 y.o.   MRN: [id number]  No chief complaint on file.    Assessment & Plan:  There are no diagnoses linked to this encounter.  Past Medical History:  Diagnosis Date   Acute medial meniscus tear of right knee 10/26/2021   Medical history non-contributory     Past Surgical History:  Procedure Laterality Date   colonoscopy with biopsy  08/24/2020   FRACTURE SURGERY Right    finger   KNEE ARTHROSCOPY WITH MEDIAL MENISECTOMY Right 10/30/2021   Procedure: KNEE ARTHROSCOPY WITH MEDIAL MENISECTOMY;  Surgeon: Salvatore Marvel, MD;  Location: [facility];  Service: Orthopedics;  Laterality: Right;    Outpatient Medications Prior to Visit  Medication Sig Dispense Refill   drospirenone-ethinyl estradiol (YAZ) 3-0.02 MG tablet Take 1 tablet by mouth daily. 84 tablet 3   escitalopram (LEXAPRO) 5 MG tablet TAKE 1 TABLET (5 MG TOTAL) BY MOUTH DAILY. 90 tablet 1   hydrOXYzine (ATARAX) 10 MG tablet Take 1 tablet (10 mg total) by mouth 3 (three) times daily as needed for anxiety (May cause drowsiness). 30 tablet 0   No facility-administered medications prior to visit.    No Known Allergies     Objective:   Physical Exam Vitals and nursing note reviewed.  Constitutional:      General:  Pt is not in acute distress.    Appearance: Normal appearance.  HENT:     Head: Normocephalic.  Pulmonary:     Effort: No respiratory distress.  Musculoskeletal:     Cervical back: Normal range of motion.  Skin:    General: Skin is dry.     Coloration: Skin is not pale.  Neurological:     Mental Status: Pt is alert and oriented to person, place, and time.  Psychiatric:        Mood and Affect: Mood normal.   LMP 08/05/2023 (Approximate)   Wt Readings from Last 3 Encounters:  09/02/23 131 lb 2 oz (59.5 kg)  02/11/22 112 lb 8 oz (51 kg)  01/28/22 116 lb (52.6 kg)      I discussed the assessment and treatment plan with the patient. The patient was provided an opportunity to ask questions and all were answered. The patient agreed with the plan and demonstrated an understanding of the instructions.   The patient was advised to call back or seek an in-person evaluation if the symptoms worsen or if the condition fails to improve as anticipated.  Dulce Sellar, NP [facility] [phone] (phone) [phone] (fax)  [facility]

## 2023-10-10 NOTE — Progress Notes (Signed)
pt not seen/

## 2023-11-02 ENCOUNTER — Encounter (INDEPENDENT_AMBULATORY_CARE_PROVIDER_SITE_OTHER): Payer: Self-pay

## 2023-11-02 ENCOUNTER — Ambulatory Visit (INDEPENDENT_AMBULATORY_CARE_PROVIDER_SITE_OTHER): Payer: No Typology Code available for payment source | Admitting: Otolaryngology

## 2023-11-02 VITALS — BP 108/74 | HR 83 | Ht 70.0 in | Wt 127.0 lb

## 2023-11-02 DIAGNOSIS — R0981 Nasal congestion: Secondary | ICD-10-CM

## 2023-11-02 DIAGNOSIS — S022XXA Fracture of nasal bones, initial encounter for closed fracture: Secondary | ICD-10-CM

## 2023-11-03 NOTE — Progress Notes (Signed)
Patient ID: Whitney Gardner, female   DOB: [birth year], 23 y.o.   MRN: [id number]  CC: Nasal fractures  HPI:  Whitney Gardner is a 23 y.o. female who presents today for evaluation of her nasal fractures.  According to the patient, she was hit on her nose by a softball 3 weeks ago.  It resulted in significant epistaxis, nasal edema, and bilateral periorbital ecchymosis.  She was evaluated at [facility] orthopedics.  Her x-ray showed nondisplaced nasal fractures.  The patient presents today complaining of mild right-sided nasal congestion.  She has also noted slight prominence of the right nasal dorsum.  Her nasal edema and periorbital ecchymosis have resolved.  She denies any visual change or diplopia.  She has no previous ENT surgery.  Past Medical History:  Diagnosis Date   Acute medial meniscus tear of right knee 10/26/2021   Medical history non-contributory     Past Surgical History:  Procedure Laterality Date   colonoscopy with biopsy  08/24/2020   FRACTURE SURGERY Right    finger   KNEE ARTHROSCOPY WITH MEDIAL MENISECTOMY Right 10/30/2021   Procedure: KNEE ARTHROSCOPY WITH MEDIAL MENISECTOMY;  Surgeon: Salvatore Marvel, MD;  Location: [facility];  Service: Orthopedics;  Laterality: Right;    History reviewed. No pertinent family history.  Social History:  reports that she has never smoked. She has never used smokeless tobacco. She reports that she does not drink alcohol and does not use drugs.  Allergies: No Known Allergies  Prior to Admission medications   Medication Sig Start Date End Date Taking? Authorizing Provider  drospirenone-ethinyl estradiol (YAZ) 3-0.02 MG tablet Take 1 tablet by mouth daily. 09/02/23  Yes Hudnell, Judeth Cornfield, NP  escitalopram (LEXAPRO) 5 MG tablet TAKE 1 TABLET (5 MG TOTAL) BY MOUTH DAILY. Patient not taking: Reported on 11/02/2023 09/24/23   Dulce Sellar, NP  hydrOXYzine (ATARAX) 10 MG tablet Take 1 tablet (10 mg total) by mouth 3 (three)  times daily as needed for anxiety (May cause drowsiness). Patient not taking: Reported on 11/02/2023 09/02/23   Dulce Sellar, NP    Blood pressure 108/74, pulse 83, height 5\' 10"  (1.778 m), weight 127 lb (57.6 kg), SpO2 96%. Exam: General: Communicates without difficulty, well nourished, no acute distress. Head: Normocephalic, no evidence injury, no tenderness, facial buttresses intact without stepoff. Face/sinus: No tenderness to palpation and percussion. Facial movement is normal and symmetric. Eyes: PERRL, EOMI. No scleral icterus, conjunctivae clear. Neuro: CN II exam reveals vision grossly intact.  No nystagmus at any point of gaze. Ears: Auricles well formed without lesions.  Ear canals are intact without mass or lesion.  No erythema or edema is appreciated.  The TMs are intact without fluid. Nose: External evaluation reveals normal support and skin without lesions.  Dorsum is intact with minimal prominence noted on the right lateral nasal dorsum.  Anterior rhinoscopy reveals congested mucosa over anterior aspect of inferior turbinates and intact septum.  No bleeding noted. Oral:  Oral cavity and oropharynx are intact, symmetric, without erythema or edema.  Mucosa is moist without lesions. Neck: Full range of motion without pain.  There is no significant lymphadenopathy.  No masses palpable.  Thyroid bed within normal limits to palpation.  Parotid glands and submandibular glands equal bilaterally without mass.  Trachea is midline. Neuro:  CN 2-12 grossly intact.   Assessment: 1.  Nondisplaced bilateral nasal fractures.  The fracture fragments have healed. 2.  No significant nasal deformity or septal deviation is noted on today's examination.  Edematous  nasal mucosa is noted. 3.  The patient has noted slight prominence of the right lateral nasal dorsum.  Plan: 1.  The physical exam findings are reviewed with the patient. 2.  The treatment options are discussed.  The options include  conservative observation versus surgical intervention with rhinoplasty.  Since the right nasal dorsal prominence is minimal, the decision is made to proceed with conservative observation. 3.  Flonase nasal spray to treat her mild nasal mucosal congestion. 4.  The patient is encouraged to call with any questions or concerns.  Cherye Gaertner W [place] Andreoni 11/03/2023, 7:44 AM
